# Patient Record
Sex: Male | Born: 1985 | Race: White | Hispanic: No | Marital: Single | Smoking: Never smoker
Health system: Southern US, Community
[De-identification: ages and names within clinical notes are randomized; demographics above are authoritative.]

## PROBLEM LIST (undated history)

## (undated) HISTORY — PX: APPENDECTOMY: SHX54

---

## 2007-10-10 ENCOUNTER — Other Ambulatory Visit: Payer: Self-pay

## 2007-10-10 ENCOUNTER — Emergency Department: Payer: Self-pay | Admitting: Emergency Medicine

## 2009-06-16 ENCOUNTER — Emergency Department: Payer: Self-pay | Admitting: Emergency Medicine

## 2013-09-07 ENCOUNTER — Emergency Department: Payer: Self-pay | Admitting: Emergency Medicine

## 2013-09-08 LAB — GC/CHLAMYDIA PROBE AMP

## 2016-05-16 ENCOUNTER — Ambulatory Visit: Payer: Managed Care, Other (non HMO)

## 2016-05-16 ENCOUNTER — Encounter: Payer: Self-pay | Admitting: *Deleted

## 2016-05-16 ENCOUNTER — Ambulatory Visit
Admission: EM | Admit: 2016-05-16 | Discharge: 2016-05-16 | Disposition: A | Discharge status: Home / Self Care | Payer: Managed Care, Other (non HMO) | Attending: Family Medicine | Admitting: Family Medicine

## 2016-05-16 ENCOUNTER — Ambulatory Visit (INDEPENDENT_AMBULATORY_CARE_PROVIDER_SITE_OTHER): Payer: Managed Care, Other (non HMO)

## 2016-05-16 DIAGNOSIS — Z23 Encounter for immunization: Secondary | ICD-10-CM

## 2016-05-16 DIAGNOSIS — S60459A Superficial foreign body of unspecified finger, initial encounter: Secondary | ICD-10-CM

## 2016-05-16 MED ORDER — TETANUS-DIPHTH-ACELL PERTUSSIS 5-2.5-18.5 LF-MCG/0.5 IM SUSP
0.5000 mL | Freq: Once | INTRAMUSCULAR | Status: AC
  Administered 2016-05-16: 0.5 mL via INTRAMUSCULAR

## 2016-05-16 MED ORDER — MUPIROCIN 2 % EX OINT: 1.0000 "application " | TOPICAL_OINTMENT | Freq: Three times a day (TID) | CUTANEOUS | Status: AC

## 2016-05-16 NOTE — Discharge Instructions (Signed)
Sliver Removal, Care After A sliver--also called a splinter--is a small and thin broken piece of an object that gets stuck (embedded) under the skin. A sliver can create a deep wound that can easily become infected. It is important to care for the wound after a sliver is removed to help prevent infection and other problems from developing. WHAT TO EXPECT AFTER THE PROCEDURE Slivers often break into smaller pieces when they are removed. If pieces of your sliver broke off and stayed in your skin, you will eventually see them working themselves out and you may feel some pain at the wound site. This is normal. HOME CARE INSTRUCTIONS  Keep all follow-up visits as directed by your health care provider. This is important.  There are many different ways to close and cover a wound, including stitches (sutures) and adhesive strips. Follow your health care provider's instructions about:  Wound care.  Bandage (dressing) changes and removal.  Wound closure removal.  Check the wound site every day for signs of infection. Watch for:  Red streaks coming from the wound.  Fever.  Redness or tenderness around the wound.  Fluid, blood, or pus coming from the wound.  A bad smell coming from the wound. SEEK MEDICAL CARE IF:  You think that a piece of the sliver is still in your skin.  Your wound was closed, as with sutures, and the edges of the wound break open.  You have signs of infection, including:  New or worsening redness around the wound.  New or worsening tenderness around the wound.  Fluid, blood, or pus coming from the wound.  A bad smell coming from the wound or dressing. SEEK IMMEDIATE MEDICAL CARE IF: You have any of the following signs of infection:  Red streaks coming from the wound.  An unexplained fever.   This information is not intended to replace advice given to you by your health care provider. Make sure you discuss any questions you have with your health care  provider.   Document Released: 10/11/2000 Document Revised: 11/04/2014 Document Reviewed: 06/16/2014 Elsevier Interactive Patient Education 2016 [employer]  

## 2016-05-16 NOTE — ED Notes (Signed)
While using grinder, pt had debris forced under right ring finger nail. Occurred last night.

## 2016-05-16 NOTE — ED Provider Notes (Signed)
CSN: [id number]     Arrival date & time 05/16/16  0810 History   None    Chief Complaint  Patient presents with  . Nail Problem  . Finger Injury   (Consider location/radiation/quality/duration/timing/severity/associated sxs/prior Treatment) HPI  30 year old male who presents with an injury to his right dominant fourth finger. He states last night he was using a grinder to grind metal in his finger came in contact with the grinding wheel causing him to have a foreign body injected into the ulnar nail bed underneath the nail. He doesn't know exactly the material that was inserted under his nail but there is some staining of the subungual area. There is no swelling erythema or significant tenderness at the present time. He is not current on his tetanus toxoid.        History reviewed. No pertinent past medical history. Past Surgical History  Procedure Laterality Date  . Appendectomy     Family History  Problem Relation Age of Onset  . COPD Mother   . COPD Father    Social History  Substance Use Topics  . Smoking status: Never Smoker   . Smokeless tobacco: None  . Alcohol Use: Yes    Review of Systems  Constitutional: Positive for activity change. Negative for fever, chills and fatigue.  Skin: Positive for wound.  All other systems reviewed and are negative.   Allergies  Shellfish allergy  Home Medications   Prior to Admission medications   Medication Sig Start Date End Date Taking? Authorizing Provider  mupirocin ointment (BACTROBAN) 2 % Apply 1 application topically 3 (three) times daily. 05/16/16   Lutricia Feil, PA-C   Meds Ordered and Administered this Visit   Medications  Tdap (BOOSTRIX) injection 0.5 mL (0.5 mLs Intramuscular Given 05/16/16 0838)    BP 132/81 mmHg  Pulse 75  Temp(Src) 98.1 F (36.7 C) (Oral)  Resp 16  Ht  (1.753 m)  Wt 210 lb (95.255 kg)  BMI 31.00 kg/m2  SpO2 100% No data found.   Physical Exam  Constitutional: He is oriented  to person, place, and time. He appears well-developed and well-nourished. No distress.  HENT:  Head: Normocephalic and atraumatic.  Eyes: Conjunctivae are normal. Pupils are equal, round, and reactive to light.  Neck: Normal range of motion. Neck supple.  Musculoskeletal: Normal range of motion.  Examination of the right dominant ring finger shows a dark staining over the ulnar nail bed. No foreign body is protruding from the nail. Nails are very closely cropped. There is no erythema tenderness discharge or swelling present.  Neurological: He is alert and oriented to person, place, and time.  Skin: Skin is warm and dry. He is not diaphoretic. No erythema.  Psychiatric: He has a normal mood and affect. His behavior is normal. Judgment and thought content normal.  Nursing note and vitals reviewed.   ED Course  .Foreign Body Removal Date/Time: 05/16/2016 9:59 AM Performed by: Lutricia Feil Authorized by: Hassan Rowan Consent: Verbal consent obtained. Risks and benefits: risks, benefits and alternatives were discussed Consent given by: patient Patient understanding: patient states understanding of the procedure being performed Patient identity confirmed: verbally with patient, arm band and hospital-assigned identification number Body area: skin General location: upper extremity Location details: right ring finger Anesthesia: digital block Local anesthetic: lidocaine 1% without epinephrine Anesthetic total: 4 ml Patient sedated: no Patient restrained: no Patient cooperative: yes Localization method: serial x-rays and visualized Removal mechanism: forceps and hemostat Dressing: antibiotic ointment and  dressing applied Tendon involvement: none Depth: subcutaneous Complexity: simple 1 objects recovered. Objects recovered: metal shard Post-procedure assessment: foreign body removed Patient tolerance: Patient tolerated the procedure well with no immediate complications    (including critical care time)  Labs Review Labs Reviewed - No data to display  Imaging Review Dg Finger Ring Right  05/16/2016  CLINICAL DATA:  Removal of foreign body EXAM: RIGHT RING FINGER 2+V COMPARISON:  Right fourth finger films of 05/16/2016 FINDINGS: Compared to the prior images, no linear opaque foreign body is currently seen. Alignment is normal. Joint spaces appear normal. IMPRESSION: No linear opaque foreign body is noted. Electronically Signed   By: Dwyane DeePaul  Barry M.D.   On: 05/16/2016 09:56   Dg Finger Ring Right  05/16/2016  CLINICAL DATA:  Pain in periungual region EXAM: RIGHT FOURTH FINGER 2+V COMPARISON:  None. FINDINGS: Frontal, oblique, and lateral views were obtained. There is a linear radiopaque foreign body extending along the medial aspect of the nail bed of the fourth digit. There is no ungual disruption appreciable by radiography. No fracture or dislocation. Joint spaces appear normal. No erosive change. IMPRESSION: Linear radiopaque foreign body extending along the medial aspect of the nail bed of the fourth digit. No bony abnormality. Joint spaces are normal. Electronically Signed   By: Bretta BangWilliam  Woodruff III M.D.   On: 05/16/2016 09:07     Visual Acuity Review  Right Eye Distance:   Left Eye Distance:   Bilateral Distance:    Right Eye Near:   Left Eye Near:    Bilateral Near:     Medications  Tdap (BOOSTRIX) injection 0.5 mL (0.5 mLs Intramuscular Given 05/16/16 0838)      MDM   1. Foreign body of finger of right hand, initial encounter    New Prescriptions   MUPIROCIN OINTMENT (BACTROBAN) 2 %    Apply 1 application topically 3 (three) times daily.  Plan: 1. Test/x-ray results and diagnosis reviewed with patient 2. rx as per orders; risks, benefits, potential side effects reviewed with patient 3. Recommend supportive treatment with Washing 3 times a day drying thoroughly and applying Bactroban. We'll see him in 2 days for follow-up for wound check and  if that does not show any evidence of infection on a when necessary basis or with his primary care physician. 4. F/u prn if symptoms worsen or don't improve      Lutricia FeilWilliam P Assata Juncaj, PA-C 05/16/16 1012

## 2016-05-18 ENCOUNTER — Encounter: Payer: Self-pay | Admitting: Gynecology

## 2016-05-18 ENCOUNTER — Ambulatory Visit
Admission: EM | Admit: 2016-05-18 | Discharge: 2016-05-18 | Disposition: A | Discharge status: Home / Self Care | Payer: Managed Care, Other (non HMO) | Attending: Family Medicine | Admitting: Family Medicine

## 2016-05-18 DIAGNOSIS — Z5189 Encounter for other specified aftercare: Secondary | ICD-10-CM

## 2016-05-18 NOTE — Discharge Instructions (Signed)
° °  Follow up with your primary care physician this week as needed. Return to Urgent care for new or worsening concerns.  ° °

## 2016-05-18 NOTE — ED Provider Notes (Signed)
[facility]  ____________________________________________  Time seen: Approximately 9:21 AM  I have reviewed the triage vital signs and the nursing notes.   HISTORY  Chief Complaint Wound Check   HPI Kyle Cobb is a 30 y.o. male presents for wound check of right fourth finger. Patient reports he was in urgent care 2 days ago after having a piece of metal that was in bed beneath his finger nail removed. Patient reports he is applying the topical antibiotic Bactroban as directed. Patient reports that he is feeling much better. Patient denies any pain, fevers, drainage, decreased range of motion, decreased sensation or any other complaints. Patient states he is here as he was directed to have a wound check but reports that he is feeling well. Reports tetanus immunization was updated at last visit.   History reviewed. No pertinent past medical history.  There are no active problems to display for this patient.   Past Surgical History  Procedure Laterality Date  . Appendectomy      Current Outpatient Rx  Name  Route  Sig  Dispense  Refill  . mupirocin ointment (BACTROBAN) 2 %   Topical   Apply 1 application topically 3 (three) times daily.   22 g   0     Allergies Shellfish allergy  Family History  Problem Relation Age of Onset  . COPD Mother   . COPD Father     Social History Social History  Substance Use Topics  . Smoking status: Never Smoker   . Smokeless tobacco: None  . Alcohol Use: Yes    Review of Systems Constitutional: No fever/chills Eyes: No visual changes. ENT: No sore throat. Cardiovascular: Denies chest pain. Respiratory: Denies shortness of breath. Gastrointestinal: No abdominal pain.  No nausea, no vomiting.  No diarrhea.  No constipation. Genitourinary: Negative for dysuria. Musculoskeletal: Negative for back pain. Skin: Negative for rash. Neurological: Negative for headaches, focal weakness or numbness.  10-point ROS  otherwise negative.  ____________________________________________   PHYSICAL EXAM:  VITAL SIGNS: ED Triage Vitals  Enc Vitals Group     BP 05/18/16 0825 124/77 mmHg     Pulse Rate 05/18/16 0825 69     Resp 05/18/16 0825 16     Temp 05/18/16 0825 98.2 F (36.8 C)     Temp Source 05/18/16 0825 Oral     SpO2 05/18/16 0825 99 %     Weight 05/18/16 0825 210 lb (95.255 kg)     Height 05/18/16 0825 5\' 9"  (1.753 m)     Head Cir --      Peak Flow --      Pain Score 05/18/16 0828 0     Pain Loc --      Pain Edu? --      Excl. in GC? --     Constitutional: Alert and oriented. Well appearing and in no acute distress. Eyes: Conjunctivae are normal. PERRL. EOMI. Head: Atraumatic.  Ears: Normal external appearance bilaterally.  Mouth/Throat: Mucous membranes are moist.   Cardiovascular: Normal rate, regular rhythm. Grossly normal heart sounds.  Good peripheral circulation. Respiratory: Normal respiratory effort.  No retractions. Lungs CTAB. No wheezes, rales or rhonchi. Musculoskeletal: Ambulatory with steady gait. Right fourth finger nail with minimal darkish discoloration in a linear form to the ulnar aspect of the nail, nontender, full range of motion, normal capillary refill, no motor or tendon deficit, no drainage or exudate, no surrounding swelling or erythema, right hand nontender. Neurologic:  Normal speech and language. No  gross focal neurologic deficits are appreciated. Skin:  Skin is warm, dry and intact. No rash noted. Psychiatric: Mood and affect are normal. Speech and behavior are normal.  ____________________________________________   LABS (all labs ordered are listed, but only abnormal results are displayed)  Labs Reviewed - No data to display _  INITIAL IMPRESSION / ASSESSMENT AND PLAN / ED COURSE  Pertinent labs & imaging results that were available during my care of the patient were reviewed by me and considered in my medical decision making (see chart for  details).  Well-appearing patient. No acute distress. Presenting for wound check to right fourth finger in which he had a piece of metal embedded beneath his finger nail that was removed 2 days ago. Patient denies complaints. Wound appears to be healing well. No signs of active bacterial infection. Discussed monitoring and cleaning and continuing topical antibiotic.  Discussed follow up with Primary care physician this week. Discussed follow up and return parameters including no resolution or any worsening concerns. Patient verbalized understanding and agreed to plan.   ____________________________________________   FINAL CLINICAL IMPRESSION(S) / ED DIAGNOSES  Final diagnoses:  Visit for wound check     Discharge Medication List as of 05/18/2016  8:39 AM      Note: This dictation was prepared with Dragon dictation along with smaller phrase technology. Any transcriptional errors that result from this process are unintentional.       Renford Dills, NP 05/18/16 [id number]

## 2016-05-18 NOTE — ED Notes (Signed)
Wound check  Right fourth finger injury x 2 days.

## 2017-03-13 IMAGING — CR DG FINGER RING 2+V*R*
3 series · 3 of 3 positions shown · non-contrast
Comparison: Right fourth finger films of 05/16/2016

CLINICAL DATA: Removal of foreign body

EXAM:
RIGHT RING FINGER 2+V

[finger ap]
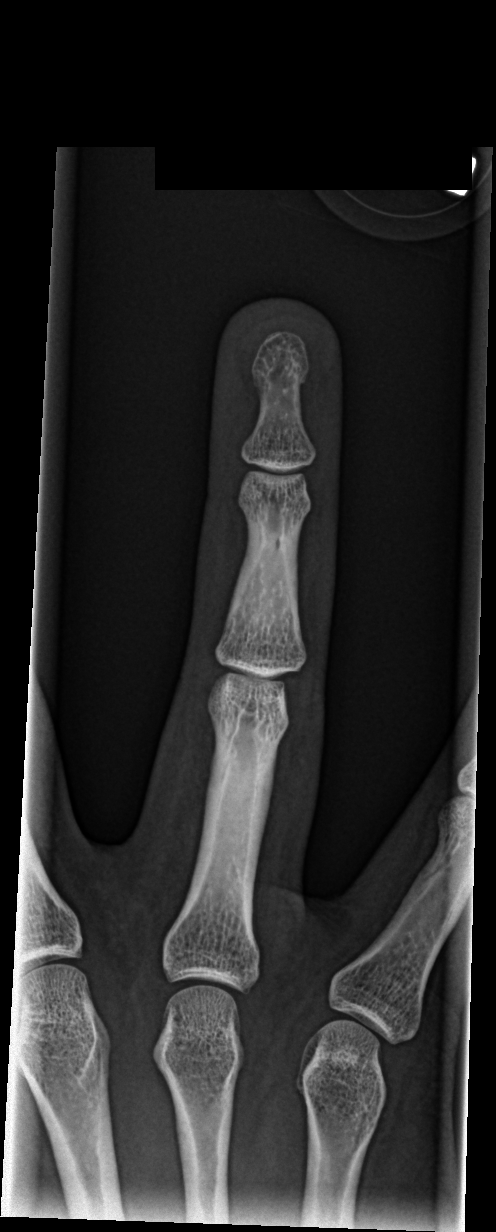

[finger obl]
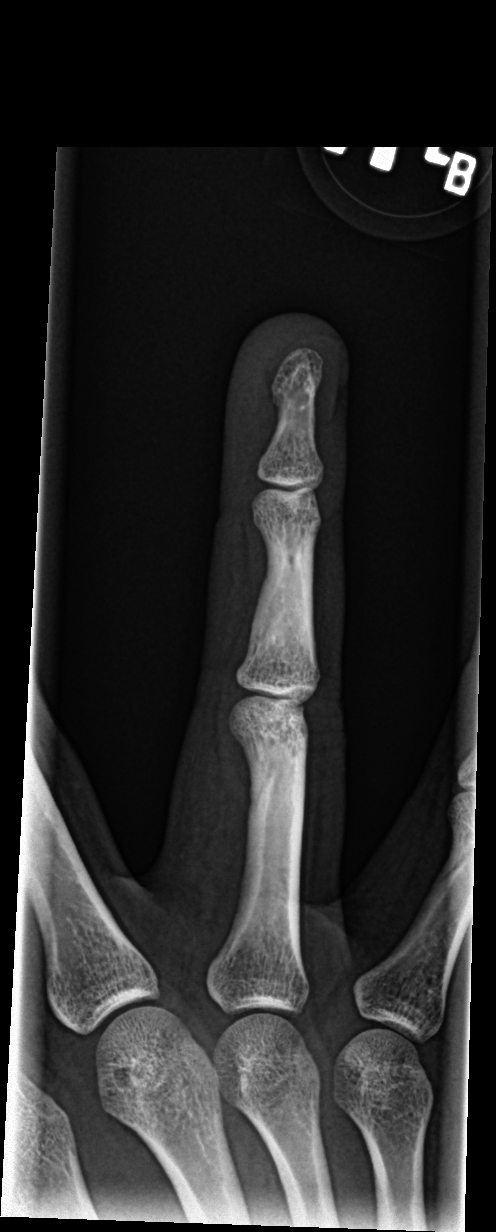

[finger lat]
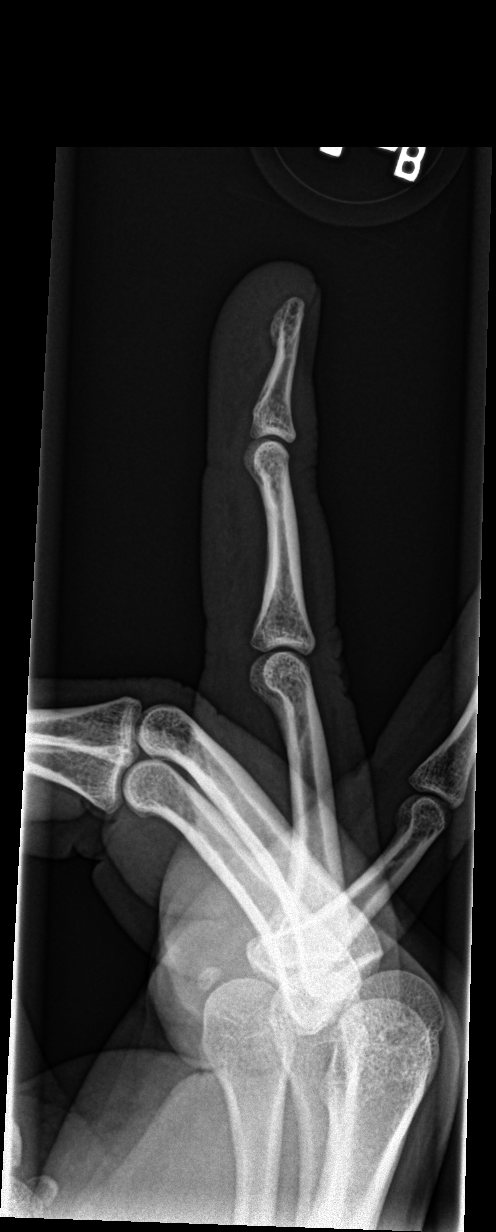

[3 of 3 positions shown; findings below may reference images not displayed]

FINDINGS: Compared to the prior images, no linear opaque foreign body is
currently seen. Alignment is normal. Joint spaces appear normal.
IMPRESSION: No linear opaque foreign body is noted.
